# Patient Record
Sex: Male | Born: 1940 | Race: White | Hispanic: No | Smoking: Former smoker
Health system: Southern US, Community
[De-identification: ages and names within clinical notes are randomized; demographics above are authoritative.]

## PROBLEM LIST (undated history)

## (undated) DIAGNOSIS — I82409 Acute embolism and thrombosis of unspecified deep veins of unspecified lower extremity: Secondary | ICD-10-CM

## (undated) DIAGNOSIS — G4730 Sleep apnea, unspecified: Secondary | ICD-10-CM

## (undated) DIAGNOSIS — E785 Hyperlipidemia, unspecified: Secondary | ICD-10-CM

## (undated) DIAGNOSIS — Z8701 Personal history of pneumonia (recurrent): Secondary | ICD-10-CM

## (undated) DIAGNOSIS — Z8719 Personal history of other diseases of the digestive system: Secondary | ICD-10-CM

## (undated) DIAGNOSIS — J449 Chronic obstructive pulmonary disease, unspecified: Secondary | ICD-10-CM

## (undated) HISTORY — DX: Personal history of other diseases of the digestive system: Z87.19

## (undated) HISTORY — DX: Personal history of pneumonia (recurrent): Z87.01

## (undated) HISTORY — DX: Acute embolism and thrombosis of unspecified deep veins of unspecified lower extremity: I82.409

## (undated) HISTORY — PX: TONSILLECTOMY: SUR1361

## (undated) HISTORY — DX: Hyperlipidemia, unspecified: E78.5

## (undated) HISTORY — DX: Chronic obstructive pulmonary disease, unspecified: J44.9

---

## 2003-10-30 DIAGNOSIS — I82409 Acute embolism and thrombosis of unspecified deep veins of unspecified lower extremity: Secondary | ICD-10-CM

## 2003-10-30 HISTORY — DX: Acute embolism and thrombosis of unspecified deep veins of unspecified lower extremity: I82.409

## 2008-10-29 DIAGNOSIS — Z8701 Personal history of pneumonia (recurrent): Secondary | ICD-10-CM

## 2008-10-29 DIAGNOSIS — G4730 Sleep apnea, unspecified: Secondary | ICD-10-CM

## 2008-10-29 DIAGNOSIS — J449 Chronic obstructive pulmonary disease, unspecified: Secondary | ICD-10-CM

## 2008-10-29 HISTORY — DX: Personal history of pneumonia (recurrent): Z87.01

## 2008-10-29 HISTORY — DX: Chronic obstructive pulmonary disease, unspecified: J44.9

## 2008-10-29 HISTORY — DX: Sleep apnea, unspecified: G47.30

## 2011-01-30 ENCOUNTER — Encounter (INDEPENDENT_AMBULATORY_CARE_PROVIDER_SITE_OTHER): Payer: Medicare Other | Admitting: Vascular Surgery

## 2011-01-30 DIAGNOSIS — I714 Abdominal aortic aneurysm, without rupture: Secondary | ICD-10-CM

## 2011-01-30 NOTE — Consult Note (Signed)
NEW PATIENT CONSULTATION  Willits, Tradarius DOB:  07-24-1941                                       01/30/2011 ZOXWR#:[id number]  The patient presents today for evaluation of infrarenal abdominal aortic aneurysm.  He is a very pleasant 70 year old gentleman who was found by CT scan to have an infrarenal aneurysm.  This was on a CT scan for evaluation of his chest.  This had incomplete visualization but the lower cuts of the chest did show what appeared to be at least a 5.5 cm infrarenal abdominal aortic aneurysm.  He subsequently underwent ultrasound in August of 2011 and this revealed a maximal diameter of 4.8 cm.  He recently underwent a repeat ultrasound in March of 2012 and this showed an aneurysm maximum of 5.2 cm by ultrasound.  He has no symptoms referable to this.  He has no symptoms associated with aneurysm.  He does have extensive past medical history.  Most prominently he has severe COPD with continual oxygen therapy.  He quit smoking in 2010.  He does have a history of DVT in 2004 and is on chronic Coumadin therapy. Does have history of hyperlipidemia under treatment, diabetes, prior history of pneumonia, history of esophageal reflux.  PAST SURGICAL HISTORY:  Only past surgical history is tonsillectomy at age 78.  FAMILY HISTORY:  Mother with diabetes, deceased, and with cancer.  SOCIAL HISTORY:  He is a retired [employer] architect.  He is married.  He does not drink alcohol and no illicit drug use.  REVIEW OF SYSTEMS:  He does have some weight gain up to 190 pounds.  He is 5 feet 4 inches tall. CARDIAC:  Positive for shortness of breath with exertion. PULMONARY:  Oxygen use. HEMATOLOGIC:  History of prior DVT. GU, ENT, musculoskeletal, psychiatric and skin rest of his review of systems are all negative.  PHYSICAL EXAMINATION:  General:  A well-developed white male appearing stated age in no acute distress, using oxygen.  Vital signs:  His blood pressure is  116/73, pulse 97, respirations 22.  HEENT:  Normal.  Chest: Clear bilaterally without rales, rhonchi or wheezes.  Heart:  Regular rate and rhythm.  He does have 2+ radial pulses bilaterally.  Carotid arteries without bruits bilaterally.  He does have 2+ dorsalis pedis pulses bilaterally.  He does have significant edema and changes of chronic venous stasis disease bilaterally in both lower extremities. Abdomen:  Obese and nontender.  I do not feel any masses.  I cannot palpate an aneurysm.  Musculoskeletal:  Shows no major deformities or cyanosis.  Neurological:  No focal weakness or paresthesias.  Skin: Without ulcers or rashes.  I did review his ultrasound and also reviewed his CT films.  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  This is some variance from his ultrasound.  I discussed all of this at length with the patient and his wife present.  I explained the treatment options for aneurysms with both open surgical repair and endovascular stent graft.  He certainly appears to be borderline option for this due to the mural thrombus at his level of the renal arteries. I explained we would require a formal duplex to determine if he is able to proceed with stent graft repair.  We will  obtain a CT angiogram at [facility] in the next several weeks at his convenience and then see him again for discussion and review of the films with him.    Larina Earthly, M.D. Electronically Signed  TFE/MEDQ  D:  01/30/2011  T:  01/30/2011  Job:  [id number]  cc:   Gaye Alken, NP Dr Barney Drain

## 2011-02-20 ENCOUNTER — Ambulatory Visit (INDEPENDENT_AMBULATORY_CARE_PROVIDER_SITE_OTHER): Payer: Medicare Other | Admitting: Vascular Surgery

## 2011-02-20 DIAGNOSIS — I714 Abdominal aortic aneurysm, without rupture: Secondary | ICD-10-CM

## 2011-02-21 NOTE — Assessment & Plan Note (Signed)
OFFICE VISIT  ORRY, SIGL DOB:  07-10-1941                                       02/20/2011 [id number]#:[id number]  The patient presents today for a discussion regarding his infrarenal abdominal aortic aneurysm.  I had seen him on 01/30/2011 with ultrasound showing expanding infrarenal abdominal aortic aneurysm.  He is here today for followup after a subsequent CT scan on 02/07/2011 at [facility], a formal CT angiogram to determine the extent of his aneurysm. This did confirm a 5.6 infrarenal abdominal aortic aneurysm.  The aneurysm begins approximately 2 cm above the level of the renal artery. He has 2 right renal and 1 left renal artery.  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  The aneurysm stops at the bifurcation, and he has normal iliac arteries below this.  I had a long discussion with the patient and his wife present.  His physical exam is otherwise unchanged.  Vital Signs:  Blood pressure 103/65, pulse 87, respirations 18.  Abdomen:  Soft, nontender, moderately obese.  He does have palpable popliteal pulses bilaterally.  I have discussed again the indications for repair.  Since he has had expansion and is now up to 5.6 cm, I would recommend elective repair.  I explained that he is an acceptable candidate for endograft stenting for treatment of his aneurysm.  I did explain the only concern being the partial mural thrombus at the level of the implantation, I feel that he will get an adequate seal.  He is at a moderately high risk for open repair due to his severe COPD.  He understands and we will schedule this within the next 1-2 weeks at his convenience.    Larina Earthly, M.D. Electronically Signed  TFE/MEDQ  D:  02/21/2011  T:  02/21/2011  Job:  [id number]  cc:   Dr. Alfonse Spruce, NP

## 2011-02-26 ENCOUNTER — Ambulatory Visit (HOSPITAL_COMMUNITY)
Admission: RE | Admit: 2011-02-26 | Discharge: 2011-02-26 | Disposition: A | Discharge status: Home / Self Care | Payer: Medicare Other | Source: Ambulatory Visit | Attending: Vascular Surgery | Admitting: Vascular Surgery

## 2011-02-26 ENCOUNTER — Other Ambulatory Visit: Payer: Self-pay | Admitting: Vascular Surgery

## 2011-02-26 ENCOUNTER — Encounter (HOSPITAL_COMMUNITY)
Admission: RE | Admit: 2011-02-26 | Discharge: 2011-02-26 | Disposition: A | Discharge status: Home / Self Care | Payer: Medicare Other | Source: Ambulatory Visit | Attending: Vascular Surgery | Admitting: Vascular Surgery

## 2011-02-26 DIAGNOSIS — J984 Other disorders of lung: Secondary | ICD-10-CM | POA: Insufficient documentation

## 2011-02-26 DIAGNOSIS — Z01818 Encounter for other preprocedural examination: Secondary | ICD-10-CM | POA: Insufficient documentation

## 2011-02-26 DIAGNOSIS — I714 Abdominal aortic aneurysm, without rupture: Secondary | ICD-10-CM

## 2011-02-26 DIAGNOSIS — Z01812 Encounter for preprocedural laboratory examination: Secondary | ICD-10-CM | POA: Insufficient documentation

## 2011-02-26 LAB — URINALYSIS, ROUTINE W REFLEX MICROSCOPIC
Glucose, UA: NEGATIVE mg/dL
Ketones, ur: NEGATIVE mg/dL
Protein, ur: NEGATIVE mg/dL
Urobilinogen, UA: 0.2 mg/dL (ref 0.0–1.0)

## 2011-02-26 LAB — URINE MICROSCOPIC-ADD ON

## 2011-02-26 LAB — BLOOD GAS, ARTERIAL
Acid-Base Excess: 0.2 mmol/L (ref 0.0–2.0)
Bicarbonate: 24.6 mEq/L — ABNORMAL HIGH (ref 20.0–24.0)
Patient temperature: 98.6
TCO2: 25.8 mmol/L (ref 0–100)
pH, Arterial: 7.389 (ref 7.350–7.450)

## 2011-02-26 LAB — CBC
HCT: 40.9 % (ref 39.0–52.0)
Hemoglobin: 13.4 g/dL (ref 13.0–17.0)
MCHC: 32.8 g/dL (ref 30.0–36.0)
MCV: 99 fL (ref 78.0–100.0)

## 2011-02-26 LAB — COMPREHENSIVE METABOLIC PANEL
ALT: 16 U/L (ref 0–53)
AST: 18 U/L (ref 0–37)
Albumin: 3.6 g/dL (ref 3.5–5.2)
Alkaline Phosphatase: 55 U/L (ref 39–117)
Chloride: 108 mEq/L (ref 96–112)
GFR calc Af Amer: >60 mL/min (ref >60)
Potassium: 4.1 mEq/L (ref 3.5–5.1)
Sodium: 139 mEq/L (ref 135–145)
Total Bilirubin: 0.4 mg/dL (ref 0.3–1.2)
Total Protein: 6.4 g/dL (ref 6.0–8.3)

## 2011-02-26 LAB — APTT: aPTT: 47 seconds — ABNORMAL HIGH (ref 24–37)

## 2011-02-26 LAB — ABO/RH: ABO/RH(D): A POS

## 2011-02-26 LAB — PROTIME-INR: INR: 2.11 — ABNORMAL HIGH (ref 0.00–1.49)

## 2011-03-02 ENCOUNTER — Inpatient Hospital Stay (HOSPITAL_COMMUNITY): Payer: Medicare Other

## 2011-03-02 ENCOUNTER — Inpatient Hospital Stay (HOSPITAL_COMMUNITY)
Admission: RE | Admit: 2011-03-02 | Discharge: 2011-03-03 | DRG: 238 | Disposition: A | Discharge status: Home / Self Care | Payer: Medicare Other | Source: Ambulatory Visit | Attending: Vascular Surgery | Admitting: Vascular Surgery

## 2011-03-02 DIAGNOSIS — I714 Abdominal aortic aneurysm, without rupture, unspecified: Principal | ICD-10-CM | POA: Diagnosis present

## 2011-03-02 DIAGNOSIS — J4489 Other specified chronic obstructive pulmonary disease: Secondary | ICD-10-CM | POA: Diagnosis present

## 2011-03-02 DIAGNOSIS — F17200 Nicotine dependence, unspecified, uncomplicated: Secondary | ICD-10-CM | POA: Diagnosis present

## 2011-03-02 DIAGNOSIS — Z86718 Personal history of other venous thrombosis and embolism: Secondary | ICD-10-CM

## 2011-03-02 DIAGNOSIS — Z7982 Long term (current) use of aspirin: Secondary | ICD-10-CM

## 2011-03-02 DIAGNOSIS — Z7901 Long term (current) use of anticoagulants: Secondary | ICD-10-CM

## 2011-03-02 DIAGNOSIS — Z79899 Other long term (current) drug therapy: Secondary | ICD-10-CM

## 2011-03-02 DIAGNOSIS — G4733 Obstructive sleep apnea (adult) (pediatric): Secondary | ICD-10-CM | POA: Diagnosis present

## 2011-03-02 DIAGNOSIS — J449 Chronic obstructive pulmonary disease, unspecified: Secondary | ICD-10-CM | POA: Diagnosis present

## 2011-03-02 HISTORY — PX: ABDOMINAL AORTIC ANEURYSM REPAIR: SUR1152

## 2011-03-02 LAB — CBC
MCH: 32.6 pg (ref 26.0–34.0)
MCHC: 32.7 g/dL (ref 30.0–36.0)
MCV: 99.7 fL (ref 78.0–100.0)
Platelets: 194 10*3/uL (ref 150–400)
RDW: 16.4 % — ABNORMAL HIGH (ref 11.5–15.5)

## 2011-03-02 LAB — TYPE AND SCREEN

## 2011-03-02 LAB — BASIC METABOLIC PANEL
CO2: 28 mEq/L (ref 19–32)
Calcium: 8.5 mg/dL (ref 8.4–10.5)
Creatinine, Ser: 0.76 mg/dL (ref 0.4–1.5)
GFR calc non Af Amer: >60 mL/min (ref >60)
Glucose, Bld: 141 mg/dL — ABNORMAL HIGH (ref 70–99)

## 2011-03-02 LAB — URINALYSIS, ROUTINE W REFLEX MICROSCOPIC
Bilirubin Urine: NEGATIVE
Glucose, UA: NEGATIVE mg/dL
Ketones, ur: NEGATIVE mg/dL
Nitrite: NEGATIVE
Specific Gravity, Urine: 1.018 (ref 1.005–1.030)
pH: 8.5 — ABNORMAL HIGH (ref 5.0–8.0)

## 2011-03-02 LAB — APTT: aPTT: 32 seconds (ref 24–37)

## 2011-03-02 LAB — URINE MICROSCOPIC-ADD ON

## 2011-03-02 LAB — PROTIME-INR
Prothrombin Time: 16.3 seconds — ABNORMAL HIGH (ref 11.6–15.2)
Prothrombin Time: 17.4 seconds — ABNORMAL HIGH (ref 11.6–15.2)

## 2011-03-03 LAB — TYPE AND SCREEN
ABO/RH(D): A POS
Unit division: 0

## 2011-03-03 LAB — BASIC METABOLIC PANEL
BUN: 10 mg/dL (ref 6–23)
Creatinine, Ser: 0.65 mg/dL (ref 0.4–1.5)
GFR calc non Af Amer: >60 mL/min (ref >60)
Glucose, Bld: 132 mg/dL — ABNORMAL HIGH (ref 70–99)
Potassium: 4.1 mEq/L (ref 3.5–5.1)

## 2011-03-03 LAB — CBC
HCT: 33.1 % — ABNORMAL LOW (ref 39.0–52.0)
MCH: 31.7 pg (ref 26.0–34.0)
MCHC: 32 g/dL (ref 30.0–36.0)
MCV: 99.1 fL (ref 78.0–100.0)
RDW: 16.3 % — ABNORMAL HIGH (ref 11.5–15.5)

## 2011-03-06 ENCOUNTER — Other Ambulatory Visit: Payer: Self-pay | Admitting: Vascular Surgery

## 2011-03-06 DIAGNOSIS — I714 Abdominal aortic aneurysm, without rupture: Secondary | ICD-10-CM

## 2011-04-03 ENCOUNTER — Ambulatory Visit
Admission: RE | Admit: 2011-04-03 | Discharge: 2011-04-03 | Disposition: A | Discharge status: Home / Self Care | Payer: Medicare Other | Source: Ambulatory Visit | Attending: Vascular Surgery | Admitting: Vascular Surgery

## 2011-04-03 ENCOUNTER — Ambulatory Visit (INDEPENDENT_AMBULATORY_CARE_PROVIDER_SITE_OTHER): Payer: Medicare Other | Admitting: Vascular Surgery

## 2011-04-03 DIAGNOSIS — I714 Abdominal aortic aneurysm, without rupture: Secondary | ICD-10-CM

## 2011-04-03 MED ORDER — IOHEXOL 350 MG/ML SOLN
100.0000 mL | Freq: Once | INTRAVENOUS | Status: AC | PRN
  Administered 2011-04-03: 100 mL via INTRAVENOUS

## 2011-04-04 NOTE — Assessment & Plan Note (Signed)
OFFICE VISIT  Zirbes, Alan Ross DOB:  1941/03/30                                       04/03/2011 ZOXWR#:[id number]  The patient presents today for followup of stent graft repair of infrarenal abdominal aortic aneurysm on 03/02/11.  He did well in the hospital and was discharged home on postoperative day #1.  He has had no difficulty and did not fill his prescription for pain since he did not have any significant discomfort.  He has well-healed groin incisions bilaterally and has normal femoral and normal dorsalis pedis pulses bilaterally.  He underwent a CT scan today for 9-month followup, and this shows excellent positioning of his stent graft, stable maximal diameter of his sac of 5.8 cm and has no evidence of endoleak.  I discussed this at length with the patient and his wife and am pleased with his initial result.  We will see him again in 6 months for repeat CT scan.    Larina Earthly, M.D. Electronically Signed  TFE/MEDQ  D:  04/03/2011  T:  04/04/2011  Job:  [id number]  cc:   Foye Deer, MD Gaye Alken, NP

## 2011-04-17 NOTE — Op Note (Signed)
Alan Ross, Alan Ross                   ACCOUNT NO.:  [id number]  MEDICAL RECORD NO.:  [id number]           PATIENT TYPE:  I  LOCATION:  3312                         FACILITY:  [facility]  PHYSICIAN:  Larina Earthly, M.D.    DATE OF BIRTH:  09-09-1941  DATE OF PROCEDURE:  03/02/2011 DATE OF DISCHARGE:                              OPERATIVE REPORT   PREOPERATIVE DIAGNOSIS:  Infrarenal abdominal aortic aneurysm.  POSTOPERATIVE DIAGNOSIS:  Infrarenal abdominal aortic aneurysm.  PROCEDURE:  Gore stent graft repair of infrarenal abdominal aortic aneurysm.  SURGEON:  Larina Earthly, MD  ASSISTANT:  Pecola Leisure, PA-C  ANESTHESIA:  General endotracheal.  COMPLICATIONS:  None.  DISPOSITION:  To recovery room, extubated in stable condition.  INDICATIONS FOR PROCEDURE:  The patient is a 70 year old gentleman with increasingly enlarging infrarenal abdominal aortic aneurysm. Preoperative evaluation revealed infrarenal position with approximately 2-cm infrarenal neck.  The aneurysm stopped at the level of the bifurcation.  The patient did have significant amount of mural thrombus in the infrarenal aorta just below the takeoff of the renal arteries. This was approximately one-third of the posterior aspect.  I discussed the options with Mr. Mahone.  He does have significant pulmonary compromise, feels that he would be significant to a higher risk with open repair, I feel that he would achieve long-term durability with stent grafting and I recommended this.  He understands potential for conversion to open repair or eventual need for additional treatment should he have nonhealing of his stent graft.  The patient taken to the operating room at this time for surgery.  PROCEDURE IN DETAIL:  The patient was taken to the operating room, placed in supine position where the area of abdomen and both groins prepped and draped in usual sterile fashion.  Incisions were made over both common femoral arteries  just below the inguinal ligament and carried down to isolate the common femoral arteries bilaterally.  These were mobilized to give adequate exposure and were encircled with vessel loops.  Using an 18-gauge needle, and the Seldinger technique, a guidewire was passed through the right common femoral artery up to the level of the suprarenal aorta.  An 8-French sheath was passed over the guidewire.  Similarly on the left side using a Seldinger technique, a 8- French sheath was positioned in the common femoral artery with a guidewire proximally.  A marker pigtail catheter was positioned through the right sheath and an AP projection was undertaken.  This revealed the location of the renal arteries.  Preoperative planning had been for a 23 x 12 with a 16-cm main body on imaging with a marker pigtail catheter, this was the appropriate size allowing deployment of the ipsilateral limb into the common femoral artery above the hypogastric takeoff.  An 18-French sheath was brought onto the field and after the guidewire was used to exchange with a Amplatz super stiff wire and 18-French sheath was positioned through the right groin up to the level of the aneurysm sac.  The main body portion of the graft was delivered at the appropriate level just below the level of the  renal artery takeoff. Pigtail catheter was positioned through the left sheath and a injection was taken with the device positioned to confirm the level of the renal arteries.  The proximal portion of the main body was deployed and was in good position.  Next using angled catheter and the Glidewire, the contralateral gate was cannulated, this was confirmed by placing a marker pigtail catheter in this level and twisting it to confirm adequate deployment.  This was then exchanged for an Amplatz super stiff wire after hand injection through the left femoral sheath revealed the level of the hypogastric takeoff.  A 12 mm x 12 cm length  contralateral limb was brought onto the field.  The 8-French sheath was exchanged for 12-French sheath and the 12 mm x 12 cm contralateral limb was positioned at the appropriate level and the ipsilateral limb was then completely deployed as well.  The juncture of the contralateral limb was ballooned as was the distal connection site bilaterally.  The proximal aorta was not balloon due to the posterior thrombus.  Pigtail catheter was repositioned above the level of renal arteries and a completion arteriogram revealed excellent positioning with no evidence of Endo leak.  The guidewires and sheaths were removed.  The patient had been given 5000 units of intravenous heparin before placing the large sheath. The common femoral arteries were repaired bilaterally with running 5-0 Prolene sutures.  The patient was given 50 mg of protamine to reverse the heparin.  The wound was irrigated with saline.  Hemostasis obtained with cautery.  The wounds were closed with 2-0 Vicryl subcutaneous tissue and the skin was closed with 3-0 subcuticular Vicryl stitch. Sterile dressings were applied.  The patient was extubated and taken to the recovery room in stable condition.     Larina Earthly, M.D.     TFE/MEDQ  D:  03/02/2011  T:  03/03/2011  Job:  [id number]  cc:   Dr. Foye Deer Gaye Alken, NP  Electronically Signed by Ritesh Opara M.D. on 04/17/2011 01:08:57 PM

## 2011-10-26 ENCOUNTER — Other Ambulatory Visit: Payer: Self-pay

## 2011-10-26 DIAGNOSIS — I714 Abdominal aortic aneurysm, without rupture: Secondary | ICD-10-CM

## 2011-11-20 ENCOUNTER — Ambulatory Visit: Payer: Medicare Other | Admitting: Vascular Surgery

## 2011-11-21 ENCOUNTER — Encounter: Payer: Self-pay | Admitting: Vascular Surgery

## 2011-11-26 ENCOUNTER — Encounter: Payer: Self-pay | Admitting: Vascular Surgery

## 2011-11-27 ENCOUNTER — Encounter: Payer: Self-pay | Admitting: Vascular Surgery

## 2011-11-27 ENCOUNTER — Ambulatory Visit (INDEPENDENT_AMBULATORY_CARE_PROVIDER_SITE_OTHER): Payer: Medicare Other | Admitting: Vascular Surgery

## 2011-11-27 ENCOUNTER — Ambulatory Visit
Admission: RE | Admit: 2011-11-27 | Discharge: 2011-11-27 | Disposition: A | Discharge status: Home / Self Care | Payer: Medicare Other | Source: Ambulatory Visit | Attending: Vascular Surgery | Admitting: Vascular Surgery

## 2011-11-27 VITALS — BP 117/77 | HR 67 | Resp 14 | Ht 64.5 in | Wt 183.0 lb

## 2011-11-27 DIAGNOSIS — I714 Abdominal aortic aneurysm, without rupture, unspecified: Secondary | ICD-10-CM | POA: Insufficient documentation

## 2011-11-27 MED ORDER — IOHEXOL 350 MG/ML SOLN: 75.0000 mL | Freq: Once | INTRAVENOUS | Status: AC | PRN

## 2011-11-27 NOTE — Progress Notes (Signed)
[facility] [place]   Patient name: Alan Ross MRN: [id number] DOB: 1941-09-20 Sex: male   Referred by: Waynetta Sandy  Reason for referral:  Chief Complaint  Patient presents with  . AAA    6 month follow up    HISTORY OF PRESENT ILLNESS: The patient has today for followup of stent graft repair aortic aneurysm May 2012 he had a CT scan at one month and is here today for repeat 6 month CT scan. He does have severe COPD. He reports this is stable. He has no symptoms referable to his aneurysm.  Past Medical History  Diagnosis Date  . COPD (chronic obstructive pulmonary disease)   . DVT (deep venous thrombosis) 2004  . Hyperlipidemia   . Diabetes mellitus   . History of pneumonia   . History of esophageal reflux     Past Surgical History  Procedure Date  . Tonsillectomy at age 69 per pt.  . Abdominal aortic aneurysm repair 03-02-2011     stent graft repair of abdominal aortic aneurysm   by Dr. Tawanna Cooler Aydin Hink    History   Social History  . Marital Status: Married    Spouse Name: N/A    Number of Children: N/A  . Years of Education: N/A   Occupational History  . Not on file.   Social History Main Topics  . Smoking status: Former Smoker    Quit date: 10/29/2008  . Smokeless tobacco: Not on file  . Alcohol Use: No  . Drug Use: No  . Sexually Active:    Other Topics Concern  . Not on file   Social History Narrative  . No narrative on file    Family History  Problem Relation Age of Onset  . Diabetes Mother   . Cancer Mother     Allergies as of 11/27/2011 - Review Complete 11/27/2011  Allergen Reaction Noted  . Penicillins  11/21/2011    Current Outpatient Prescriptions on File Prior to Visit  Medication Sig Dispense Refill  . aspirin 81 MG tablet Take 81 mg by mouth daily.      . Cholecalciferol (VITAMIN D3) 1000 UNITS CAPS Take by mouth daily.      . fish oil-omega-3 fatty acids 1000 MG capsule Take 2 g by mouth daily.      . folic acid  (FOLVITE) 1 MG tablet Take 1 mg by mouth daily.      . furosemide (LASIX) 20 MG tablet Take 20 mg by mouth daily.      . Iron 66 MG TABS Take by mouth.      . niacin 500 MG tablet Take 500 mg by mouth daily.      . potassium chloride (K-DUR) 10 MEQ tablet Take 10 mEq by mouth daily.      . simvastatin (ZOCOR) 40 MG tablet Take 40 mg by mouth daily.      . vitamin B-12 (CYANOCOBALAMIN) 500 MCG tablet Take 500 mcg by mouth daily.      . WARFARIN SODIUM PO Take by mouth. As directed.       Current Facility-Administered Medications on File Prior to Visit  Medication Dose Route Frequency Provider Last Rate Last Dose  . iohexol (OMNIPAQUE) 350 MG/ML injection 75 mL  75 mL Intravenous Once PRN Medication Radiologist, MD         PHYSICAL EXAMINATION:  General: The patient is a well-nourished male, in no acute distress. Vital signs are BP 117/77  Pulse 67  Resp 14  Ht 5' 4.5" (1.638 m)  Wt 183 lb (83.008 kg)  BMI 30.93 kg/m2  SpO2 92% Pulmonary: There is a good air exchange bilaterally with wheezing  Abdomen: Soft and non-tender with normal pitch bowel sounds. Obese no aneurysm palpable Musculoskeletal: There are no major deformities.  There is no significant extremity pain. Neurologic: No focal weakness or paresthesias are detected, Skin: There are no ulcer or rashes noted. Psychiatric: The patient has normal affect. Cardiovascular: There is a regular rate and rhythm without significant murmur appreciated. Pulse status: 2+ her cells pedis pulses  CT scan: Excellent positioning of stent graft with no evidence of endoleak. Slight decrease an aneurysm sac to 5.6 from 5.8 cm. Patient does have mural thrombus at the posterior attachment site proximally and this appears to be stable  Impression and Plan:  Successful treatment of infrarenal abdominal aortic aneurysm with stent graft. Facial be seen again in one year with repeat CT followup in the office visit    Carlis Burnsworth F [facility] [place] Office: [phone]

## 2012-08-22 ENCOUNTER — Other Ambulatory Visit: Payer: Self-pay | Admitting: *Deleted

## 2012-08-22 DIAGNOSIS — I714 Abdominal aortic aneurysm, without rupture: Secondary | ICD-10-CM

## 2012-08-22 DIAGNOSIS — Z48812 Encounter for surgical aftercare following surgery on the circulatory system: Secondary | ICD-10-CM

## 2012-11-24 ENCOUNTER — Encounter: Payer: Self-pay | Admitting: Vascular Surgery

## 2012-11-25 ENCOUNTER — Inpatient Hospital Stay: Admission: RE | Admit: 2012-11-25 | Payer: Medicare Other | Source: Ambulatory Visit

## 2012-11-25 ENCOUNTER — Encounter: Payer: Self-pay | Admitting: Vascular Surgery

## 2012-11-25 ENCOUNTER — Ambulatory Visit (INDEPENDENT_AMBULATORY_CARE_PROVIDER_SITE_OTHER): Payer: Medicare Other | Admitting: Vascular Surgery

## 2012-11-25 ENCOUNTER — Ambulatory Visit
Admission: RE | Admit: 2012-11-25 | Discharge: 2012-11-25 | Disposition: A | Discharge status: Home / Self Care | Payer: Medicare Other | Source: Ambulatory Visit | Attending: Vascular Surgery | Admitting: Vascular Surgery

## 2012-11-25 VITALS — BP 114/72 | HR 104 | Ht 64.5 in | Wt 171.9 lb

## 2012-11-25 DIAGNOSIS — I714 Abdominal aortic aneurysm, without rupture: Secondary | ICD-10-CM

## 2012-11-25 DIAGNOSIS — Z48812 Encounter for surgical aftercare following surgery on the circulatory system: Secondary | ICD-10-CM

## 2012-11-25 DIAGNOSIS — N200 Calculus of kidney: Secondary | ICD-10-CM

## 2012-11-25 MED ORDER — IOHEXOL 350 MG/ML SOLN
100.0000 mL | Freq: Once | INTRAVENOUS | Status: AC | PRN
  Administered 2012-11-25: 100 mL via INTRAVENOUS

## 2012-11-25 NOTE — Progress Notes (Signed)
The patient has today for followup of the stent graft repair of abdominal aortic aneurysm with a Gore stent graft in May of 2012. He recently suffered the death of his wife and is recovering from this. He has no symptoms referable to his aneurysm. He does have problems with coronary dysfunction having quit smoking in 2010. He does have a remote history of kidney stones many years ago. I questioned regarding any recent pain he does report that he had some left flank pain over the weekend. He has not had hematuria.  Past Medical History  Diagnosis Date  . COPD (chronic obstructive pulmonary disease)   . DVT (deep venous thrombosis) 2004  . Hyperlipidemia   . Diabetes mellitus   . History of pneumonia   . History of esophageal reflux     History  Substance Use Topics  . Smoking status: Former Smoker    Quit date: 10/29/2008  . Smokeless tobacco: Not on file  . Alcohol Use: No    Family History  Problem Relation Age of Onset  . Diabetes Mother   . Cancer Mother     Allergies  Allergen Reactions  . Penicillins     Current outpatient prescriptions:aspirin 81 MG tablet, Take 81 mg by mouth daily., Disp: , Rfl: ;  Cholecalciferol (VITAMIN D3) 1000 UNITS CAPS, Take by mouth daily., Disp: , Rfl: ;  fish oil-omega-3 fatty acids 1000 MG capsule, Take 2 g by mouth daily., Disp: , Rfl: ;  folic acid (FOLVITE) 1 MG tablet, Take 1 mg by mouth daily., Disp: , Rfl: ;  furosemide (LASIX) 20 MG tablet, Take 20 mg by mouth daily., Disp: , Rfl:  ipratropium-albuterol (DUONEB) 0.5-2.5 (3) MG/3ML SOLN, Inhale into the lungs 4 (four) times daily., Disp: , Rfl: ;  Iron 66 MG TABS, Take by mouth., Disp: , Rfl: ;  niacin 500 MG tablet, Take 500 mg by mouth daily., Disp: , Rfl: ;  potassium chloride (K-DUR) 10 MEQ tablet, Take 10 mEq by mouth daily., Disp: , Rfl: ;  simvastatin (ZOCOR) 40 MG tablet, Take 40 mg by mouth daily., Disp: , Rfl:  vitamin B-12 (CYANOCOBALAMIN) 500 MCG tablet, Take 500 mcg by mouth  daily., Disp: , Rfl: ;  WARFARIN SODIUM PO, Take by mouth. As directed., Disp: , Rfl:   BP 114/72  Pulse 104  Ht 5' 4.5" (1.638 m)  Wt 171 lb 14.4 oz (77.973 kg)  BMI 29.05 kg/m2  SpO2 92%  Body mass index is 29.05 kg/(m^2).       Physical exam well-developed white male no acute distress radial and femoral pulses are 2+. Abdominal exam reveals obesity no tenderness. Doesn't drink changes of chronic venous hypertension in both lower extremity is.  I reviewed his CT scan from today. This shows excellent positioning of the stent graft with no evidence of endoleak. He has had continued shrinking of the size of his aneurysm sac. Initially the maximal diameter is 5.8 and is now down to 4.6 cm.  He does have a subtle finding of a left kidney stone obstructing his ureteropelvic junction and causing some hydronephrosis.  Impression and plan #1 a stable positioning of Gore stent graft with continued drainage and size of his native aneurysm. We'll see him again in one year with repeat CT scan  #2 left obstructing kidney stone. Will refer to [facility]

## 2012-11-26 ENCOUNTER — Other Ambulatory Visit: Payer: Self-pay | Admitting: *Deleted

## 2012-11-26 DIAGNOSIS — I714 Abdominal aortic aneurysm, without rupture: Secondary | ICD-10-CM

## 2012-11-26 DIAGNOSIS — Z48812 Encounter for surgical aftercare following surgery on the circulatory system: Secondary | ICD-10-CM

## 2012-12-16 ENCOUNTER — Other Ambulatory Visit: Payer: Self-pay | Admitting: Urology

## 2012-12-16 NOTE — Pre-Procedure Instructions (Signed)
Asked to bring blue folder the day of the procedure,insurance card,I.D. driver's license,wear comfortable clothing and have a driver for the day. Asked not to take Advil,Motrin,Ibuprofen,Aleve or any NSAIDS, Aspirin, or Toradol for 72 hours prior to procedure,  No vitamins or herbal medications 7 days prior to procedure. Stopped Warfain  And Aspirin 12-15-2012 and plans to stop vitamins 12-16-2012. Instructed to take laxative per doctor's office instructions and eat a light dinner the evening before procedure.   To arrive at 0530  for lithotripsy procedure.

## 2012-12-17 ENCOUNTER — Encounter (HOSPITAL_COMMUNITY): Payer: Self-pay | Admitting: Pharmacy Technician

## 2012-12-22 ENCOUNTER — Encounter (HOSPITAL_COMMUNITY)
Admission: RE | Disposition: A | Discharge status: Home / Self Care | Payer: Self-pay | Source: Ambulatory Visit | Attending: Urology

## 2012-12-22 ENCOUNTER — Encounter (HOSPITAL_COMMUNITY): Payer: Self-pay | Admitting: *Deleted

## 2012-12-22 ENCOUNTER — Ambulatory Visit (HOSPITAL_COMMUNITY): Payer: Medicare Other

## 2012-12-22 ENCOUNTER — Ambulatory Visit (HOSPITAL_COMMUNITY)
Admission: RE | Admit: 2012-12-22 | Discharge: 2012-12-22 | Disposition: A | Discharge status: Home / Self Care | Payer: Medicare Other | Source: Ambulatory Visit | Attending: Urology | Admitting: Urology

## 2012-12-22 DIAGNOSIS — G4730 Sleep apnea, unspecified: Secondary | ICD-10-CM | POA: Insufficient documentation

## 2012-12-22 DIAGNOSIS — I714 Abdominal aortic aneurysm, without rupture, unspecified: Secondary | ICD-10-CM | POA: Insufficient documentation

## 2012-12-22 DIAGNOSIS — N201 Calculus of ureter: Secondary | ICD-10-CM | POA: Diagnosis present

## 2012-12-22 DIAGNOSIS — Z86718 Personal history of other venous thrombosis and embolism: Secondary | ICD-10-CM | POA: Insufficient documentation

## 2012-12-22 LAB — PROTIME-INR
INR: 1.01 (ref 0.00–1.49)
Prothrombin Time: 13.2 seconds (ref 11.6–15.2)

## 2012-12-22 SURGERY — LITHOTRIPSY, ESWL
Anesthesia: LOCAL | Laterality: Left

## 2012-12-22 MED ORDER — TAMSULOSIN HCL 0.4 MG PO CAPS: 0.4000 mg | ORAL_CAPSULE | ORAL | Status: AC

## 2012-12-22 MED ORDER — OXYCODONE-ACETAMINOPHEN 5-500 MG PO CAPS: 1.0000 | ORAL_CAPSULE | ORAL | Status: AC | PRN

## 2012-12-22 MED ORDER — SODIUM CHLORIDE 0.9 % IV SOLN
INTRAVENOUS | Status: DC
  Administered 2012-12-22: 07:00:00 via INTRAVENOUS

## 2012-12-22 MED ORDER — DIAZEPAM 5 MG PO TABS
10.0000 mg | ORAL_TABLET | ORAL | Status: AC
  Administered 2012-12-22: 10 mg via ORAL
  Filled 2012-12-22: qty 2

## 2012-12-22 MED ORDER — CIPROFLOXACIN HCL 500 MG PO TABS
500.0000 mg | ORAL_TABLET | ORAL | Status: AC
  Administered 2012-12-22: 500 mg via ORAL
  Filled 2012-12-22: qty 1

## 2012-12-22 MED ORDER — DIPHENHYDRAMINE HCL 25 MG PO CAPS
25.0000 mg | ORAL_CAPSULE | ORAL | Status: AC
  Administered 2012-12-22: 25 mg via ORAL
  Filled 2012-12-22: qty 1

## 2012-12-22 NOTE — Progress Notes (Addendum)
Reddened area with a few petechiae noted on left lower back from lithotripsy. No drainage noted and patient denies pain. He verbalizes understanding of discharge instructions and instructed to call for problems and concerns following discharge.

## 2012-12-22 NOTE — Op Note (Signed)
See [facility] OP note scanned into chart. 

## 2012-12-22 NOTE — H&P (Signed)
Reason For Visit     Alan Ross is a 72 year old male patient of Dr. Arbie Cookey with a left UPJ stone.   History of Present Illness     Calculus: A CT scan performed 11/25/12 revealed a 15 mm stone located at the left UPJ causing some obstruction. No other renal calculi were noted.  He told me that he has not had any flank pain or hematuria. He has had stones in the past and passed a stone spontaneously in the early 70s. He has never required any form of procedure for his stones. He also has no history of UTIs, pyelonephritis or other urologic difficulty. He has no voiding complaints other than some frequency after he takes his Lasix in the morning. .   Past Medical History Problems  1. History of  Adult Sleep Apnea 780.57 2. History of  Chronic Obstructive Pulmonary Disease 496 3. History of  Diabetes Mellitus 250.00 4. History of  Esophageal Reflux 530.81 5. History of  Gastric Ulcer 531.90 6. History of  Hyperlipidemia 272.4 7. History of  Pneumonia V12.61 8. 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 History of  Cath Stent Placement 2. History of  Surgery For Abdominal Aortic Aneurysm 3. History of  Tonsillectomy  Current Meds 1. Albuterol Sulfate NEBU; Therapy: (Recorded:18Feb2014) to 2. Aspirin 81 MG Oral Tablet; Therapy: (Recorded:18Feb2014) to 3. Ferrous Sulfate 325 MG CAPS; Therapy: (Recorded:18Feb2014) to 4. Fish Oil 1000 MG Oral Capsule; Therapy: (Recorded:18Feb2014) to 5. Folic Acid 1 MG Oral Tablet; Therapy: (Recorded:18Feb2014) to 6. Furosemide 20 MG Oral Tablet; Therapy: 25Jul2013 to 7. Niacin 500 MG Oral Tablet; Therapy: (Recorded:18Feb2014) to 8. Oxygen Use; Therapy: (Recorded:18Feb2014) to 9. Potassium Chloride ER 10 MEQ Oral Tablet Extended Release; Therapy: 21Jan2013 to 10. Simvastatin 40 MG Oral Tablet; Therapy: (Recorded:18Feb2014) to 11. Vitamin B-12 500 MCG Oral Tablet; Therapy: (Recorded:18Feb2014)  to 12. Vitamin D3 10000 UNIT Oral Capsule; Therapy: (Recorded:18Feb2014) to 13. Warfarin Sodium TABS; Therapy: (Recorded:18Feb2014) to  Allergies Medication  1. Penicillins  Family History Problems  1. Paternal history of  Acute Myocardial Infarction V17.3 2. Family history of  Death In The Family Father 3. Family history of  Death In The Family Mother 4. Maternal history of  Diabetes Mellitus V18.0 5. Family history of  Family Health Status Children ___ Living Sons  Social History Problems    Caffeine Use   Former Smoker V15.82   Marital History - Widowed   Retired From Work Denied    History of  Alcohol Use  Review of Systems Genitourinary, constitutional, skin, eye, otolaryngeal, hematologic/lymphatic, cardiovascular, pulmonary, endocrine, musculoskeletal, gastrointestinal, neurological and psychiatric system(s) were reviewed and pertinent findings if present are noted.  Genitourinary: urinary frequency and feelings of urinary urgency.  Respiratory: shortness of breath.  Psychiatric: anxiety.    Vitals Vital Signs  Blood Pressure: 128 / 80 Temperature: 97.1 F Heart Rate: 88  BMI Calculated: 28.84 BSA Calculated: 1.84 Height: 5 ft 4.5 in Weight: 171 lb   Physical Exam Constitutional: Well nourished and well developed . No acute distress.  ENT:. The ears and nose are normal in appearance.  Neck: The appearance of the neck is normal and no neck mass is present.  Pulmonary: No respiratory distress and normal respiratory rhythm and effort.  Cardiovascular: Heart rate and rhythm are normal . No peripheral edema.  Abdomen: The abdomen is mildly obese. The abdomen is soft and nontender. No masses are palpated. No  CVA tenderness. No hernias are palpable. No hepatosplenomegaly noted.  Genitourinary: Examination of the penis demonstrates no discharge, no masses, no lesions and a normal meatus. The scrotum is without lesions. The right epididymis is palpably normal and  non-tender. The left epididymis is palpably normal and non-tender. The right testis is non-tender and without masses. The left testis is non-tender and without masses.  Lymphatics: The femoral and inguinal nodes are not enlarged or tender.  Skin: Normal skin turgor, no visible rash and no visible skin lesions.  Neuro/Psych:. Mood and affect are appropriate.    Results/Data Urine     COLOR YELLOW   APPEARANCE CLOUDY   SPECIFIC GRAVITY 1.020   pH 6.5   GLUCOSE NEG mg/dL  BILIRUBIN NEG   KETONE NEG mg/dL  BLOOD SMALL   PROTEIN TRACE mg/dL  UROBILINOGEN 0.2 mg/dL  NITRITE NEG   LEUKOCYTE ESTERASE MOD   SQUAMOUS EPITHELIAL/HPF NONE SEEN   WBC 11-20 WBC/hpf  RBC 3-6 RBC/hpf  BACTERIA MODERATE   CRYSTALS NONE SEEN   CASTS NONE SEEN    Old records or history reviewed: Notes from Dr. Bosie Helper office.  The following images/tracing/specimen were independently visualized:  CT scan as above.  The following radiology reports were reviewed: CT scan.    Assessment Assessed  1. Proximal Ureteral Stone On The Left 592.1 2. Hydronephrosis On The Left 591      We discussed the management of urinary stones. These options include observation, ureteroscopy, shockwave lithotripsy, and PCNL. We discussed which options are relevant to these particular stones. We discussed the natural history of stones as well as the complications of untreated stones and the impact on quality of life without treatment as well as with each of the above listed treatments. We also discussed the efficacy of each treatment in its ability to clear the stone burden. With any of these management options I discussed the signs and symptoms of infection and the need for emergent treatment should these be experienced. For each option we discussed the ability of each procedure to clear the patient of their stone burden.  For observation I described the risks which include but are not limited to silent renal damage, life-threatening  infection, need for emergent surgery, failure to pass stone, and pain.  For ureteroscopy I described the risks which include heart attack, stroke, pulmonary embolus, death, bleeding, infection, damage to contiguous structures, positioning injury, ureteral stricture, ureteral avulsion, ureteral injury, need for ureteral stent, inability to perform ureteroscopy, need for an interval procedure, inability to clear stone burden, stent discomfort and pain.  For shockwave lithotripsy I described the risks which include arrhythmia, kidney contusion, kidney hemorrhage, need for transfusion, long-term risk of diabetes or hypertension, back discomfort, flank ecchymosis, flank abrasion, inability to break up stone, inability to pass stone fragments, Steinstrasse, infection associated with obstructing stones, need for different surgical procedure, need for repeat shockwave lithotripsy, and death.  For PCNL I described the risks including heart attack, sure, pulmonary embolus, death, positioning injury, pneumothorax, hydrothorax, need for chest tube, inability to clear stone burden, renal laceration, arterial venous fistula or malformation, need for embolization of kidney, loss of kidney or renal function, need for repeat procedure, need for prolonged nephrostomy tube, ureteral avulsion, fistula.   In his particular situation I think he would be asked suited for treatment with lithotripsy. We discussed the fact that a stent could be placed preoperatively however I think treatment without a stent would be reasonable with the understanding that if he does develop  obstructing fragments a stent would need to be placed. In addition he would need to come off his Coumadin and aspirin prior to the procedure. His kidney does appear obstructed by the stone and therefore this cannot be postponed for a significant duration of time without developing permanent damage to the kidney.   Plan    1. He will stop his aspirin and  Coumadin prior to his lithotripsy. 2. He is scheduled for lithotripsy of his left UPJ stone.

## 2012-12-27 HISTORY — PX: LITHOTRIPSY: SUR834

## 2013-01-21 ENCOUNTER — Other Ambulatory Visit: Payer: Self-pay | Admitting: Urology

## 2013-01-21 ENCOUNTER — Encounter (HOSPITAL_BASED_OUTPATIENT_CLINIC_OR_DEPARTMENT_OTHER): Payer: Self-pay | Admitting: *Deleted

## 2013-01-21 NOTE — Progress Notes (Addendum)
To [facility] at 0645- KUB,istat on arrival,has PT/INR scheduled for 01/22/13-check results in epic prior to a unnecessary  recheck on 01/26/13.Npo after Mn-instructed to use his inhaler that am and bring with him,also to bring his cpap and mask.

## 2013-01-25 NOTE — H&P (Signed)
History of Present Illness   Alan Ross is a 72 year old male with a history of left ureteral calculus disease.      Calculus: A CT scan performed 11/25/12 revealed a 15 mm stone located at the left UPJ causing some obstruction. No other renal calculi were noted.   He underwent lithotripsy of his left UPJ stone on 12/22/12. There was good fragmentation with passage of a majority of the fragments. He continued, however, to have left flank pain. His stone analysis revealed the stone was 85% calcium oxalate in 15% calcium phosphate.  Past Medical History Problems  1. History of  Adult Sleep Apnea 780.57 2. History of  Chronic Obstructive Pulmonary Disease 496 3. History of  Diabetes Mellitus 250.00 4. History of  Esophageal Reflux 530.81 5. History of  Gastric Ulcer 531.90 6. History of  Hyperlipidemia 272.4 7. History of  Pneumonia V12.61 8. 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 History of  Cath Stent Placement 2. History of  Surgery For Abdominal Aortic Aneurysm 3. History of  Tonsillectomy  Current Meds 1. Albuterol Sulfate NEBU; Therapy: (Recorded:18Feb2014) to 2. Aspirin 81 MG Oral Tablet; Therapy: (Recorded:18Feb2014) to 3. Ferrous Sulfate 325 MG CAPS; Therapy: (Recorded:18Feb2014) to 4. Fish Oil 1000 MG Oral Capsule; Therapy: (Recorded:18Feb2014) to 5. Folic Acid 1 MG Oral Tablet; Therapy: (Recorded:18Feb2014) to 6. Furosemide 20 MG Oral Tablet; Therapy: 25Jul2013 to 7. Niacin 500 MG Oral Tablet; Therapy: (Recorded:18Feb2014) to 8. Oxygen Use; Therapy: (Recorded:18Feb2014) to 9. Potassium Chloride ER 10 MEQ Oral Tablet Extended Release; Therapy: 21Jan2013 to 10. Simvastatin 40 MG Oral Tablet; Therapy: (Recorded:18Feb2014) to 11. Vitamin B-12 500 MCG Oral Tablet; Therapy: (Recorded:18Feb2014) to 12. Vitamin D3 10000 UNIT Oral Capsule; Therapy: (Recorded:18Feb2014) to 13. Warfarin Sodium TABS; Therapy:  (Recorded:18Feb2014) to  Allergies Medication  1. Penicillins  Family History Problems  1. Paternal history of  Acute Myocardial Infarction V17.3 2. Family history of  Death In The Family Father 3. Family history of  Death In The Family Mother 4. Maternal history of  Diabetes Mellitus V18.0 5. Family history of  Family Health Status Children ___ Living Sons  Social History Problems    Caffeine Use   Former Smoker V15.82   Marital History - Widowed   Retired From Work Denied    History of  Alcohol Use  Review of Systems Genitourinary, constitutional, skin, eye, otolaryngeal, hematologic/lymphatic, cardiovascular, pulmonary, endocrine, musculoskeletal, gastrointestinal, neurological and psychiatric system(s) were reviewed and pertinent findings if present are noted.  Genitourinary: urinary frequency and feelings of urinary urgency.  Respiratory: shortness of breath.  Psychiatric: anxiety.    Vitals Vital Signs  Blood Pressure: 128 / 80 Temperature: 97.1 F Heart Rate: 88  BMI Calculated: 28.84 BSA Calculated: 1.84 Height: 5 ft 4.5 in Weight: 171 lb   Physical Exam Constitutional: Well nourished and well developed . No acute distress.  ENT:. The ears and nose are normal in appearance.  Neck: The appearance of the neck is normal and no neck mass is present.  Pulmonary: No respiratory distress and normal respiratory rhythm and effort.  Cardiovascular: Heart rate and rhythm are normal . No peripheral edema.  Abdomen: The abdomen is mildly obese. The abdomen is soft and nontender. No masses are palpated. No CVA tenderness. No hernias are palpable. No hepatosplenomegaly noted.  Genitourinary: Examination of the penis demonstrates no discharge, no masses, no lesions and a normal meatus. The scrotum is without lesions. The right  epididymis is palpably normal and non-tender. The left epididymis is palpably normal and non-tender. The right testis is non-tender and without  masses. The left testis is non-tender and without masses.  Lymphatics: The femoral and inguinal nodes are not enlarged or tender.  Skin: Normal skin turgor, no visible rash and no visible skin lesions.  Neuro/Psych:. Mood and affect are appropriate.    KUB: stable calcifications in the lower pelvis- I do believe one or two are ureteral fragments and the rest are pelvic phleboliths; no other obvious stones seen within either kidney or along the contour of the ureters left renal u/s: There are no masses or lesions seen.  There is a stone seen within the left kidney.  There is also mild hydronephrosis present.  A calcification is seen within the bladder, but the bladder is otherwise empty and without abnormalities.  Impression: With continued left flank pain and calcifications seen in the distal left ureteral region the patient was counseled regarding the fact that these are very likely stone fragments that have failed to pass spontaneously despite medical expulsive therapy. It is possible that these may not actually be ureteral calculi and as he has multiple calcifications within the pelvis although most appear to be round and consistent with phleboliths. I therefore have discussed proceeding with cystoscopy and left retrograde pyelogram to confirm the presence of distal ureteral stones and then treat these ureteroscopically. I've gone over the procedure in detail including its risks and complications, the alternatives, and anticipated postoperative course and the probability of success. The patient understands and has elected to proceed.   Plan: Cystoscopy with left retrograde pyelogram and probable ureteroscopy with stone extraction.

## 2013-01-26 ENCOUNTER — Encounter (HOSPITAL_BASED_OUTPATIENT_CLINIC_OR_DEPARTMENT_OTHER)
Admission: RE | Disposition: A | Discharge status: Home / Self Care | Payer: Self-pay | Source: Ambulatory Visit | Attending: Urology

## 2013-01-26 ENCOUNTER — Ambulatory Visit (HOSPITAL_COMMUNITY): Payer: Medicare Other

## 2013-01-26 ENCOUNTER — Encounter (HOSPITAL_BASED_OUTPATIENT_CLINIC_OR_DEPARTMENT_OTHER): Payer: Self-pay | Admitting: Anesthesiology

## 2013-01-26 ENCOUNTER — Ambulatory Visit (HOSPITAL_BASED_OUTPATIENT_CLINIC_OR_DEPARTMENT_OTHER)
Admission: RE | Admit: 2013-01-26 | Discharge: 2013-01-26 | Disposition: A | Discharge status: Home / Self Care | Payer: Medicare Other | Source: Ambulatory Visit | Attending: Urology | Admitting: Urology

## 2013-01-26 ENCOUNTER — Encounter (HOSPITAL_BASED_OUTPATIENT_CLINIC_OR_DEPARTMENT_OTHER): Payer: Self-pay | Admitting: *Deleted

## 2013-01-26 ENCOUNTER — Ambulatory Visit (HOSPITAL_BASED_OUTPATIENT_CLINIC_OR_DEPARTMENT_OTHER): Payer: Medicare Other | Admitting: Anesthesiology

## 2013-01-26 DIAGNOSIS — E785 Hyperlipidemia, unspecified: Secondary | ICD-10-CM | POA: Insufficient documentation

## 2013-01-26 DIAGNOSIS — G4730 Sleep apnea, unspecified: Secondary | ICD-10-CM | POA: Insufficient documentation

## 2013-01-26 DIAGNOSIS — Z8711 Personal history of peptic ulcer disease: Secondary | ICD-10-CM | POA: Insufficient documentation

## 2013-01-26 DIAGNOSIS — N201 Calculus of ureter: Secondary | ICD-10-CM | POA: Insufficient documentation

## 2013-01-26 DIAGNOSIS — E119 Type 2 diabetes mellitus without complications: Secondary | ICD-10-CM | POA: Insufficient documentation

## 2013-01-26 DIAGNOSIS — Z7901 Long term (current) use of anticoagulants: Secondary | ICD-10-CM | POA: Insufficient documentation

## 2013-01-26 DIAGNOSIS — Z8701 Personal history of pneumonia (recurrent): Secondary | ICD-10-CM | POA: Insufficient documentation

## 2013-01-26 DIAGNOSIS — F411 Generalized anxiety disorder: Secondary | ICD-10-CM | POA: Insufficient documentation

## 2013-01-26 DIAGNOSIS — K219 Gastro-esophageal reflux disease without esophagitis: Secondary | ICD-10-CM | POA: Insufficient documentation

## 2013-01-26 DIAGNOSIS — J449 Chronic obstructive pulmonary disease, unspecified: Secondary | ICD-10-CM | POA: Insufficient documentation

## 2013-01-26 DIAGNOSIS — Z880 Allergy status to penicillin: Secondary | ICD-10-CM | POA: Insufficient documentation

## 2013-01-26 DIAGNOSIS — I739 Peripheral vascular disease, unspecified: Secondary | ICD-10-CM | POA: Insufficient documentation

## 2013-01-26 DIAGNOSIS — Z79899 Other long term (current) drug therapy: Secondary | ICD-10-CM | POA: Insufficient documentation

## 2013-01-26 DIAGNOSIS — Z86718 Personal history of other venous thrombosis and embolism: Secondary | ICD-10-CM | POA: Insufficient documentation

## 2013-01-26 DIAGNOSIS — J4489 Other specified chronic obstructive pulmonary disease: Secondary | ICD-10-CM | POA: Insufficient documentation

## 2013-01-26 DIAGNOSIS — Z7982 Long term (current) use of aspirin: Secondary | ICD-10-CM | POA: Insufficient documentation

## 2013-01-26 HISTORY — PX: CYSTOSCOPY WITH RETROGRADE PYELOGRAM, URETEROSCOPY AND STENT PLACEMENT: SHX5789

## 2013-01-26 HISTORY — DX: Sleep apnea, unspecified: G47.30

## 2013-01-26 LAB — POCT I-STAT 4, (NA,K, GLUC, HGB,HCT)
Glucose, Bld: 77 mg/dL (ref 70–99)
HCT: 28 % — ABNORMAL LOW (ref 39.0–52.0)
Hemoglobin: 9.5 g/dL — ABNORMAL LOW (ref 13.0–17.0)
Potassium: 3.5 mEq/L (ref 3.5–5.1)
Sodium: 137 mEq/L (ref 135–145)

## 2013-01-26 LAB — PROTIME-INR: Prothrombin Time: 20 seconds — ABNORMAL HIGH (ref 11.6–15.2)

## 2013-01-26 SURGERY — CYSTOURETEROSCOPY, WITH RETROGRADE PYELOGRAM AND STENT INSERTION
Anesthesia: General

## 2013-01-26 MED ORDER — OXYCODONE HCL 5 MG PO TABS
5.0000 mg | ORAL_TABLET | Freq: Once | ORAL | Status: DC | PRN
  Filled 2013-01-26: qty 1

## 2013-01-26 MED ORDER — HYDROMORPHONE HCL PF 1 MG/ML IJ SOLN
0.2500 mg | INTRAMUSCULAR | Status: DC | PRN
  Filled 2013-01-26: qty 1

## 2013-01-26 MED ORDER — ONDANSETRON HCL 4 MG/2ML IJ SOLN
INTRAMUSCULAR | Status: DC | PRN
  Administered 2013-01-26: 4 mg via INTRAVENOUS

## 2013-01-26 MED ORDER — SODIUM CHLORIDE 0.9 % IR SOLN
Status: DC | PRN
  Administered 2013-01-26: 9000 mL via INTRAVESICAL

## 2013-01-26 MED ORDER — IOHEXOL 350 MG/ML SOLN
INTRAVENOUS | Status: DC | PRN
  Administered 2013-01-26: 50 mL via URETHRAL

## 2013-01-26 MED ORDER — FENTANYL CITRATE 0.05 MG/ML IJ SOLN
INTRAMUSCULAR | Status: DC | PRN
  Administered 2013-01-26: 50 ug via INTRAVENOUS
  Administered 2013-01-26 (×2): 25 ug via INTRAVENOUS

## 2013-01-26 MED ORDER — PROPOFOL 10 MG/ML IV BOLUS
INTRAVENOUS | Status: DC | PRN
  Administered 2013-01-26: 140 mg via INTRAVENOUS

## 2013-01-26 MED ORDER — HYDROCODONE-ACETAMINOPHEN 7.5-325 MG PO TABS: 1.0000 | ORAL_TABLET | ORAL | Status: AC | PRN

## 2013-01-26 MED ORDER — CIPROFLOXACIN IN D5W 400 MG/200ML IV SOLN
400.0000 mg | INTRAVENOUS | Status: AC
  Administered 2013-01-26: 400 mg via INTRAVENOUS
  Filled 2013-01-26: qty 200

## 2013-01-26 MED ORDER — MEPERIDINE HCL 25 MG/ML IJ SOLN
6.2500 mg | INTRAMUSCULAR | Status: DC | PRN
  Filled 2013-01-26: qty 1

## 2013-01-26 MED ORDER — DEXAMETHASONE SODIUM PHOSPHATE 4 MG/ML IJ SOLN
INTRAMUSCULAR | Status: DC | PRN
  Administered 2013-01-26: 8 mg via INTRAVENOUS

## 2013-01-26 MED ORDER — LIDOCAINE HCL (CARDIAC) 20 MG/ML IV SOLN
INTRAVENOUS | Status: DC | PRN
  Administered 2013-01-26: 50 mg via INTRAVENOUS

## 2013-01-26 MED ORDER — PHENAZOPYRIDINE HCL 200 MG PO TABS: 200.0000 mg | ORAL_TABLET | Freq: Three times a day (TID) | ORAL | Status: AC | PRN

## 2013-01-26 MED ORDER — PROMETHAZINE HCL 25 MG/ML IJ SOLN
6.2500 mg | INTRAMUSCULAR | Status: DC | PRN
  Filled 2013-01-26: qty 1

## 2013-01-26 MED ORDER — LIDOCAINE HCL 2 % EX GEL
CUTANEOUS | Status: DC | PRN
  Administered 2013-01-26: 1

## 2013-01-26 MED ORDER — OXYCODONE HCL 5 MG/5ML PO SOLN
5.0000 mg | Freq: Once | ORAL | Status: DC | PRN
  Filled 2013-01-26: qty 5

## 2013-01-26 MED ORDER — LACTATED RINGERS IV SOLN
INTRAVENOUS | Status: DC
  Administered 2013-01-26: 100 mL/h via INTRAVENOUS
  Filled 2013-01-26: qty 1000

## 2013-01-26 MED ORDER — ACETAMINOPHEN 10 MG/ML IV SOLN
1000.0000 mg | Freq: Once | INTRAVENOUS | Status: DC | PRN
  Filled 2013-01-26: qty 100

## 2013-01-26 SURGICAL SUPPLY — 35 items
ADAPTER CATH URET PLST 4-6FR (CATHETERS) IMPLANT
BAG DRAIN URO-CYSTO SKYTR STRL (DRAIN) ×3 IMPLANT
BASKET LASER NITINOL 1.9FR (BASKET) IMPLANT
BASKET STNLS GEMINI 4WIRE 3FR (BASKET) IMPLANT
BASKET ZERO TIP NITINOL 2.4FR (BASKET) IMPLANT
BRUSH URET BIOPSY 3F (UROLOGICAL SUPPLIES) IMPLANT
CANISTER SUCT LVC 12 LTR MEDI- (MISCELLANEOUS) IMPLANT
CATH CLEAR GEL 3F BACKSTOP (CATHETERS) IMPLANT
CATH INTERMIT  6FR 70CM (CATHETERS) IMPLANT
CATH URET 5FR 28IN CONE TIP (BALLOONS)
CATH URET 5FR 70CM CONE TIP (BALLOONS) IMPLANT
CLOTH BEACON ORANGE TIMEOUT ST (SAFETY) ×3 IMPLANT
DRAPE CAMERA CLOSED 9X96 (DRAPES) ×3 IMPLANT
ELECT REM PT RETURN 9FT ADLT (ELECTROSURGICAL)
ELECTRODE REM PT RTRN 9FT ADLT (ELECTROSURGICAL) IMPLANT
GLOVE BIO SURGEON STRL SZ8 (GLOVE) ×3 IMPLANT
GLOVE BIOGEL M 6.5 STRL (GLOVE) ×3 IMPLANT
GLOVE BIOGEL M 7.0 STRL (GLOVE) ×3 IMPLANT
GOWN PREVENTION PLUS LG XLONG (DISPOSABLE) ×3 IMPLANT
GOWN STRL REIN XL XLG (GOWN DISPOSABLE) ×3 IMPLANT
GUIDEWIRE 0.038 PTFE COATED (WIRE) IMPLANT
GUIDEWIRE ANG ZIPWIRE 038X150 (WIRE) IMPLANT
GUIDEWIRE STR DUAL SENSOR (WIRE) ×3 IMPLANT
IV NS IRRIG 3000ML ARTHROMATIC (IV SOLUTION) ×6 IMPLANT
KIT BALLIN UROMAX 15FX10 (LABEL) IMPLANT
KIT BALLN UROMAX 15FX4 (MISCELLANEOUS) IMPLANT
KIT BALLN UROMAX 26 75X4 (MISCELLANEOUS)
LASER FIBER DISP (UROLOGICAL SUPPLIES) IMPLANT
PACK CYSTOSCOPY (CUSTOM PROCEDURE TRAY) ×3 IMPLANT
SET HIGH PRES BAL DIL (LABEL)
SHEATH ACCESS URETERAL 38CM (SHEATH) IMPLANT
SHEATH ACCESS URETERAL 54CM (SHEATH) IMPLANT
SHEATH URET ACCESS 12FR/35CM (UROLOGICAL SUPPLIES) ×3 IMPLANT
SHEATH URET ACCESS 12FR/55CM (UROLOGICAL SUPPLIES) IMPLANT
WATER STERILE IRR 3000ML UROMA (IV SOLUTION) IMPLANT

## 2013-01-26 NOTE — Interval H&P Note (Signed)
History and Physical Interval Note:  01/26/2013 8:04 AM  Alan Ross  has presented today for surgery, with the diagnosis of LEFT URETERAL STONE. He continues to have intermittent sharp left sided pain.   The various methods of treatment have been discussed with the patient and family. After consideration of risks, benefits and other options for treatment, the patient has consented to  Procedure(s): CYSTOSCOPY WITH LEFT RETROGRADE PYELOGRAM, LEFT URETEROSCOPY  (Left) HOLMIUM LASER APPLICATION (N/A) as a surgical intervention .  The patient's history has been reviewed, patient examined, no change in status, stable for surgery.  I have reviewed the patient's chart and labs.  Questions were answered to the patient's satisfaction.     Garnett Farm

## 2013-01-26 NOTE — Anesthesia Procedure Notes (Signed)
Procedure Name: LMA Insertion Date/Time: 01/26/2013 8:33 AM Performed by: Renella Cunas D Pre-anesthesia Checklist: Patient identified, Emergency Drugs available, Suction available and Patient being monitored Patient Re-evaluated:Patient Re-evaluated prior to inductionOxygen Delivery Method: Circle System Utilized Preoxygenation: Pre-oxygenation with 100% oxygen Intubation Type: IV induction Ventilation: Mask ventilation without difficulty LMA: LMA inserted LMA Size: 4.0 Number of attempts: 1 Airway Equipment and Method: bite block Placement Confirmation: positive ETCO2 Tube secured with: Tape Dental Injury: Teeth and Oropharynx as per pre-operative assessment

## 2013-01-26 NOTE — Transfer of Care (Signed)
Immediate Anesthesia Transfer of Care Note  Patient: Alan Ross  Procedure(s) Performed: Procedure(s) (LRB): CYSTOSCOPY WITH LEFT RETROGRADE PYELOGRAM, LEFT URETEROSCOPY  (Left)  Patient Location: PACU  Anesthesia Type: General  Level of Consciousness: awake, oriented, sedated and patient cooperative  Airway & Oxygen Therapy: Patient Spontanous Breathing and Patient connected to face mask oxygen  Post-op Assessment: Report given to PACU RN and Post -op Vital signs reviewed and stable  Post vital signs: Reviewed and stable  Complications: No apparent anesthesia complications

## 2013-01-26 NOTE — Anesthesia Preprocedure Evaluation (Signed)
Anesthesia Evaluation  Patient identified by MRN, date of birth, ID band Patient awake    Reviewed: Allergy & Precautions, H&P , NPO status , Patient's Chart, lab work & pertinent test results  Airway Mallampati: I TM Distance: >3 FB Neck ROM: Full    Dental  (+) Teeth Intact, Dental Advisory Given, Edentulous Upper, Missing and Poor Dentition   Pulmonary sleep apnea, Continuous Positive Airway Pressure Ventilation and Oxygen sleep apnea , COPD breath sounds clear to auscultation  Pulmonary exam normal       Cardiovascular + Peripheral Vascular Disease Rhythm:Regular Rate:Normal     Neuro/Psych negative neurological ROS  negative psych ROS   GI/Hepatic negative GI ROS, Neg liver ROS,   Endo/Other  negative endocrine ROS  Renal/GU negative Renal ROS     Musculoskeletal negative musculoskeletal ROS (+)   Abdominal   Peds  Hematology negative hematology ROS (+)   Anesthesia Other Findings   Reproductive/Obstetrics                           Anesthesia Physical Anesthesia Plan  ASA: III  Anesthesia Plan: General   Post-op Pain Management:    Induction: Intravenous  Airway Management Planned: LMA  Additional Equipment:   Intra-op Plan:   Post-operative Plan: Extubation in OR  Informed Consent: I have reviewed the patients History and Physical, chart, labs and discussed the procedure including the risks, benefits and alternatives for the proposed anesthesia with the patient or authorized representative who has indicated his/her understanding and acceptance.   Dental advisory given  Plan Discussed with: CRNA  Anesthesia Plan Comments:         Anesthesia Quick Evaluation

## 2013-01-26 NOTE — Anesthesia Postprocedure Evaluation (Signed)
Anesthesia Post Note  Patient: Alan Ross  Procedure(s) Performed: Procedure(s) (LRB): CYSTOSCOPY WITH LEFT RETROGRADE PYELOGRAM, LEFT URETEROSCOPY  (Left)  Anesthesia type: General  Patient location: PACU  Post pain: Pain level controlled  Post assessment: Post-op Vital signs reviewed  Last Vitals: BP 125/73  Pulse 93  Temp(Src) 36.6 C (Oral)  Resp 18  Ht 5' 4.5" (1.638 m)  Wt 142 lb (64.411 kg)  BMI 24.01 kg/m2  SpO2 98%  Post vital signs: Reviewed  Level of consciousness: sedated  Complications: No apparent anesthesia complications

## 2013-01-26 NOTE — Op Note (Signed)
PATIENT:  Alan Ross  PRE-OPERATIVE DIAGNOSIS: 1. History of left ureteral calculus. 2. Intermittent left-sided pain  POST-OPERATIVE DIAGNOSIS: 1. History of left ureteral calculus. 2. Intermittent left-sided pain (non-urologic)  PROCEDURE:  1. Cystoscopy 2. Left retrograde pyelogram with interpretation 3. Left ureteroscopy  SURGEON: Garnett Farm, MD  INDICATION: Mr. Loken is a 72 year old male who had a left proximal ureteral stone treated with lithotripsy. He passed multiple fragments. Despite this he continued to have pain on the left side. He was evaluated with an ultrasound revealed no evidence of perinephric fluid collection/hematoma with possible mild fullness of the collecting system. His KUBs have revealed what appeared to be calcifications in the pelvis that are primarily phleboliths although do to the presence of multiple different calcifications it is difficult to discern if any of these are definitely distal ureteral stones. We therefore have discussed further evaluation with retrograde pyelogram and ureteroscopy.  ANESTHESIA:  General  EBL:  Minimal  DRAINS: None  DESCRIPTION OF PROCEDURE: The patient was taken to the major OR and placed on the table. General anesthesia was administered and then the patient was moved to the dorsal lithotomy position. The genitalia was sterilely prepped and draped. An official timeout was performed.  Initially the 22 French cystoscope with 12 lens was passed under direct vision down the urethra which was noted be normal. The prostatic urethra revealed bilobar hypertrophy but no lesions were noted. The bladder was then entered and fully inspected. It was noted be free of any tumors stones or inflammatory lesions. There was 1+ trabeculation. Ureteral orifices were of normal configuration and position. A 6 French open-ended ureteral catheter was then passed through the cystoscope into the ureteral orifice in order to perform a left retrograde  pyelogram.  A retrograde pyelogram was performed by injecting full-strength contrast up the left ureter under direct fluoroscopic control. It revealed no definite filling defects in the distal lureter. The remainder of the ureter was noted to be normal as was the intrarenal collecting system. I then passed a 0.038 inch floppy-tipped guidewire through the open ended catheter and into the area of the renal pelvis and this was left in place. The inner portion of a ureteral access sheath was then passed over the guidewire to gently dilate the intramural ureter. I then proceeded with ureteroscopy in order to be absolutely sure there was no stone present causing his intermittent pain.  A 6 French rigid ureteroscope was then passed under direct into the bladder and into the left orifice and up the ureter. No stone was identified throughout the entire course of the ureter. I was able to pass the ureteroscope to the level of the UPJ. I then backed the scope down the ureter under direct vision once again noting no stone or or other abnormality. Ureteroscope was then removed. The bladder was drained using the cystoscope sheath. I then instilled 2% lidocaine jelly in the urethra and applied a penile clamp. The patient tolerated the procedure well no intraoperative complications.  PLAN OF CARE: Discharge to home after PACU  PATIENT DISPOSITION:  PACU - hemodynamically stable.

## 2013-01-27 ENCOUNTER — Encounter (HOSPITAL_BASED_OUTPATIENT_CLINIC_OR_DEPARTMENT_OTHER): Payer: Self-pay | Admitting: Urology

## 2013-04-28 DEATH — deceased

## 2013-05-02 IMAGING — CT CT CTA ABD/PEL W/CM AND/OR W/O CM
2 of 9 series · 11 of 46 positions shown, 17 images · IV contrast ([ID] [id number])
Comparison: 11/27/2011

CLINICAL DATA: Aortic aneurysm post stent graft.

CT ANGIOGRAPHY ABDOMEN AND PELVIS WITH CONTRAST AND WITHOUT
CONTRAST

[Series 4: angio · axial · 0.78mm/px · z∈[-328,-8]mm · 10 of 220 slices shown, 16 images]
[im 20/220  soft-tissue]
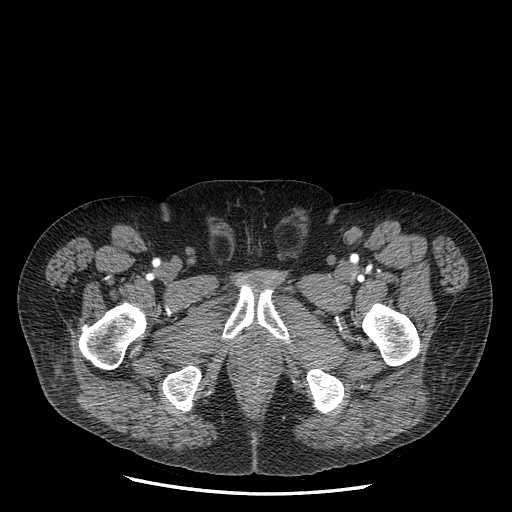
[im 20/220  bone]
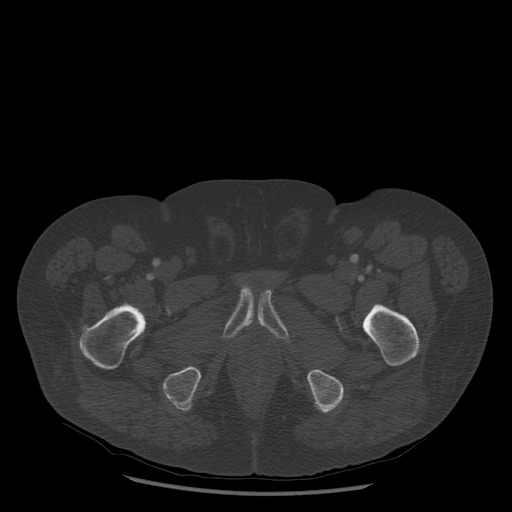
[im 40/220  soft-tissue]
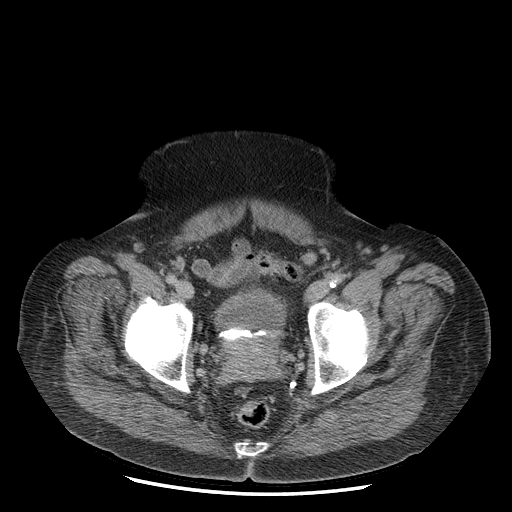
[im 60/220  soft-tissue]
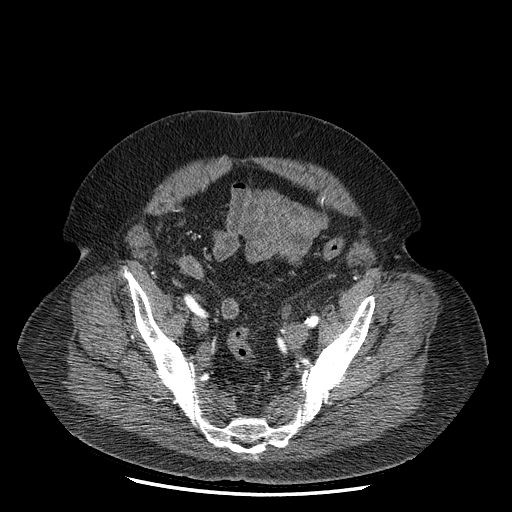
[im 80/220  soft-tissue]
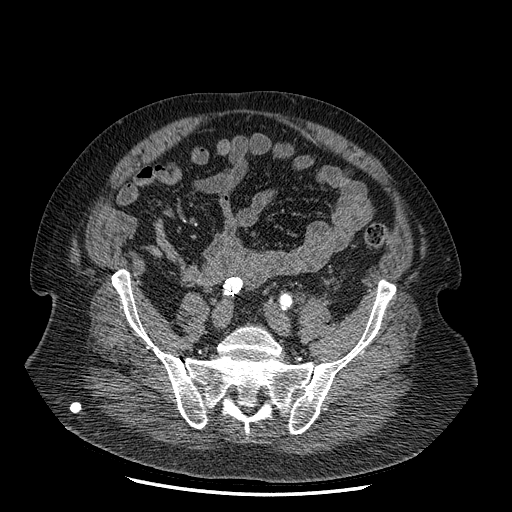
[im 100/220  soft-tissue]
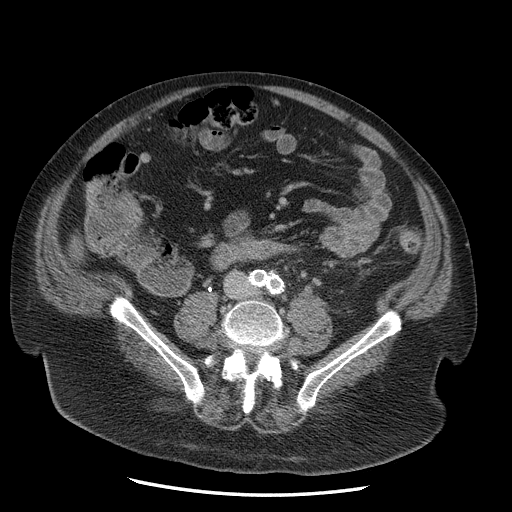
[im 120/220  soft-tissue]
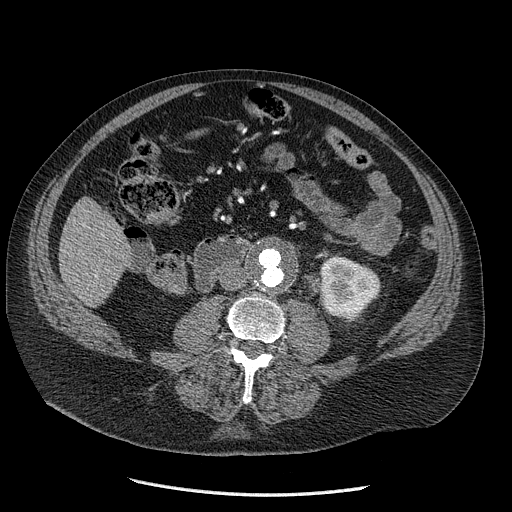
[im 140/220  soft-tissue]
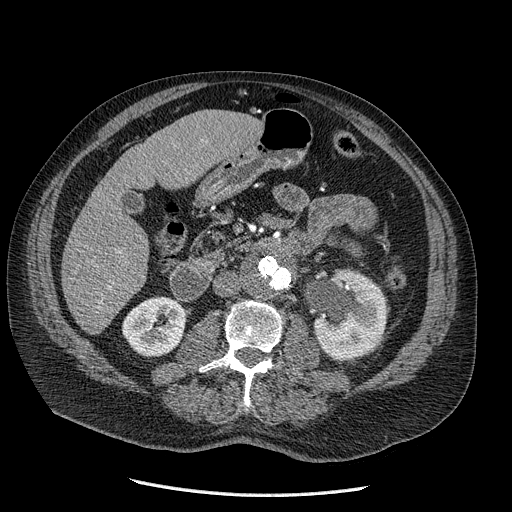
[im 140/220  lung]
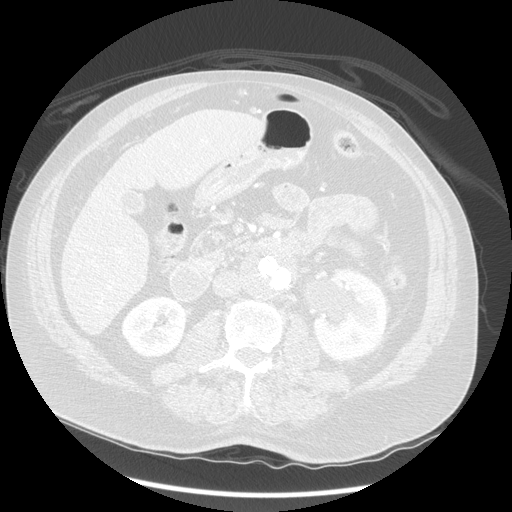
[im 160/220  soft-tissue]
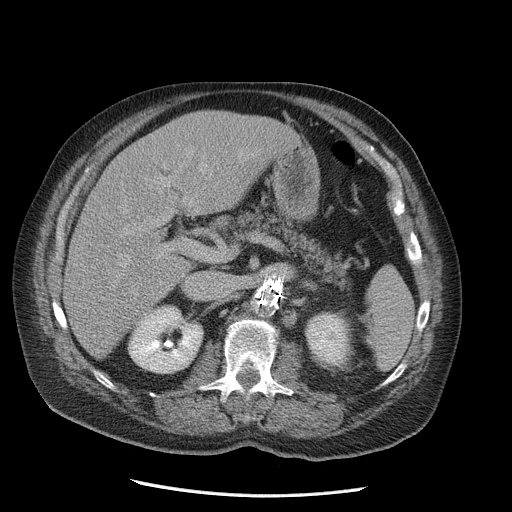
[im 160/220  lung]
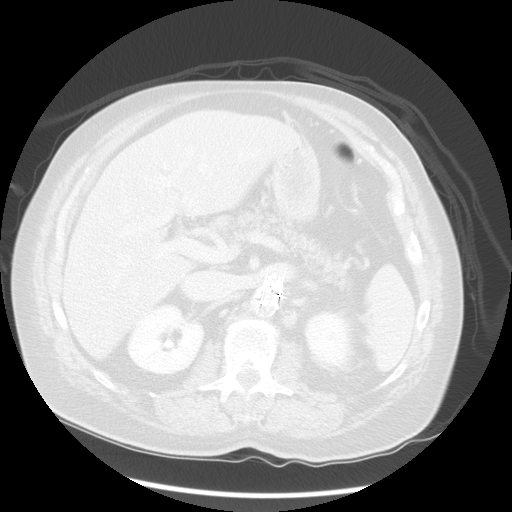
[im 180/220  soft-tissue]
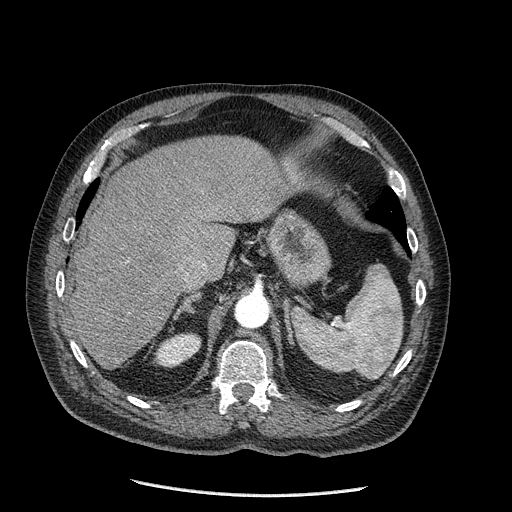
[im 180/220  lung]
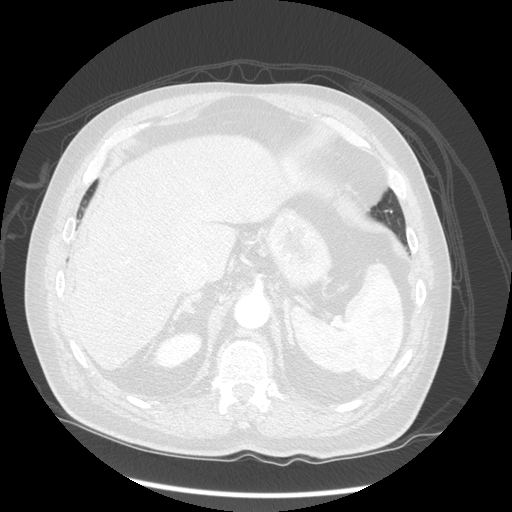
[im 180/220  bone]
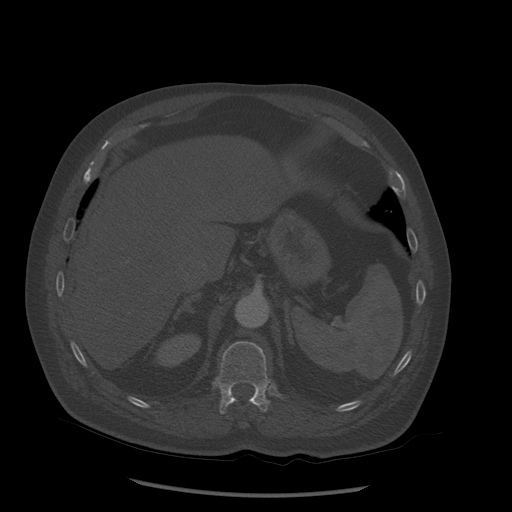
[im 200/220  soft-tissue]
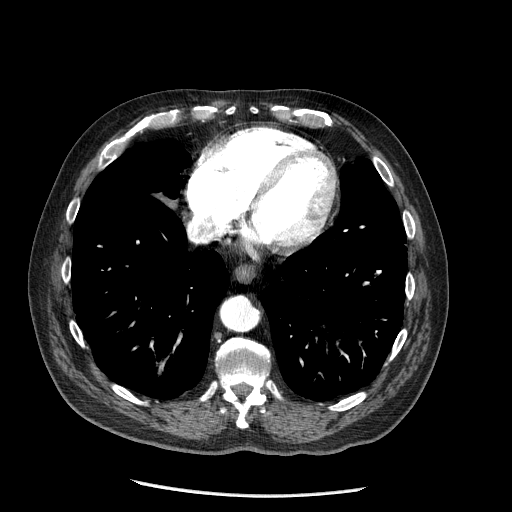
[im 200/220  lung]
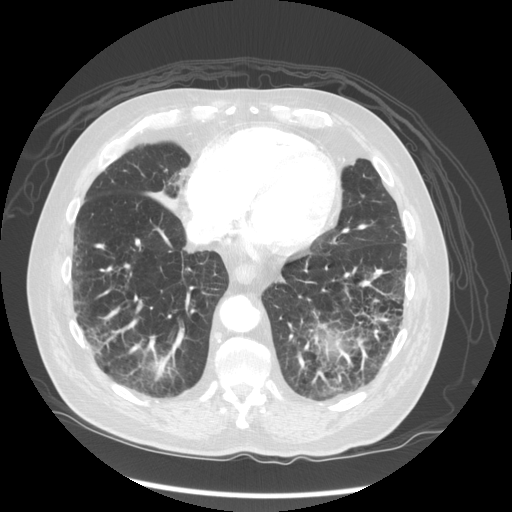

[Series 602: sagittal body · sagittal · 0.82mm/px · 1 of 154 slices shown]
[im 22/154  soft-tissue]
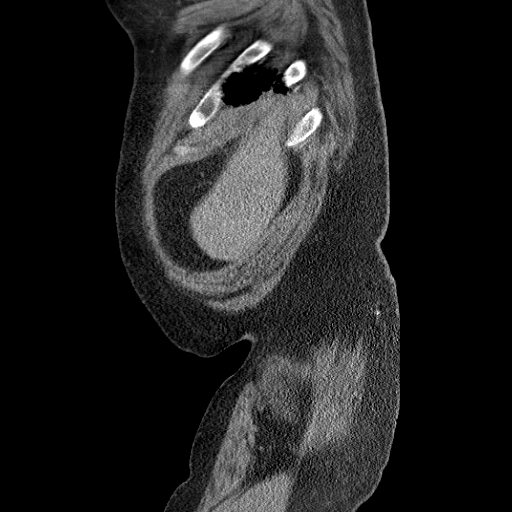

[11 of 46 positions shown; findings below may reference images not displayed]

Arterial findings:
Aorta:                  Mild eccentric nonocclusive mural thrombus
in the visualized distal descending thoracic segment.  Patent
infrarenal bifurcated stent graft.  Decrease in native aneurysm sac
size, 4.4 x 4.6 cm (previously 5.2 x 5.6).  No endoleak.

Celiac axis:            Patent, unremarkable distal branching.

Superior mesenteric:Patent, with minimal eccentric nonocclusive
plaque, normal distal branching anatomy.

Left renal:             Duplicated.  Both arising nearly the same
level.  The anterior moiety is diminutive, supplying only a portion
of the anterior division.  Posterior body is dominant, widely
patent.

Right renal:            Duplicated, both arising nearly the same
axial level.  The anterior moiety is dominant, widely patent.
Posterior moiety supplies only a portion of the posterior division,
without focal lesion.

Inferior mesenteric:Origin occlusion, with distal reconstitution by
visceral collaterals.

Left iliac:             Left limb of the stent graft is patent,
extending to the proximal common iliac without endoleak.  There is
scattered nonocclusive plaque through the distal common, external,
and internal iliacs without aneurysm or stenosis.

Right iliac:            Right limb of the stent graft extends to
the mid common iliac, patent without endoleak.  The distal native
iliac arterial system as scattered calcified plaque, no stenosis or
aneurysm.

Venous findings:  Patent hepatic veins, portal vein, splenic vein,
SMV, bilateral renal veins, IVC, and iliac venous system.

 Review of the MIP images confirms the above findings.

Nonvascular findings: 15 mm obstructing calculus at the left UPJ
with left hydronephrosis and mild inflammatory/edematous changes
around the left kidney and renal collecting system.

Unremarkable liver, nondistended gallbladder, spleen, adrenal
glands, right kidney.  Diffuse pancreatic parenchymal atrophy
without focal lesion.  Stomach, small bowel, and colon are
nondilated.  There are bilateral inguinal hernias.  The one on  the
right involves a loop of small bowel without evidence of
obstruction or strangulation.
The left contains only mesenteric fat.  Urinary bladder is
incompletely distended with multiple layering small calcifications.
There is marked enlargement of the prostate.  No ascites.  No free
air.  12 mm left para-aortic lymph node   at the level of the left
renal arteries is slightly more conspicuous than on prior study.
No other retroperitoneal, mesenteric, or pelvic adenopathy.Mild
spondylitic changes in the lumbar spine.  Coarse scarring in the
visualized lung bases with a calcified granuloma in the lateral
basal segment left lower lobe.
IMPRESSION: 1.  Stable patent infrarenal bifurcated stent graft with no
endoleak, interval decrease in size of native aneurysm sac.
2.  Obstructing 15 mm left UPJ calculus with hydronephrosis.
3.  Bilateral inguinal hernias, the right containing a loop of
small bowel without obstruction or strangulation.

## 2013-05-29 IMAGING — CR DG ABDOMEN 1V
1 series · 1 of 1 positions shown · non-contrast
Comparison: CT abdomen and pelvis 11/25/2012.  Abdomen 03/02/2011.

CLINICAL DATA: Left renal stone Prelone for trips seen.

ABDOMEN - 1 VIEW

[t abdomen supine]
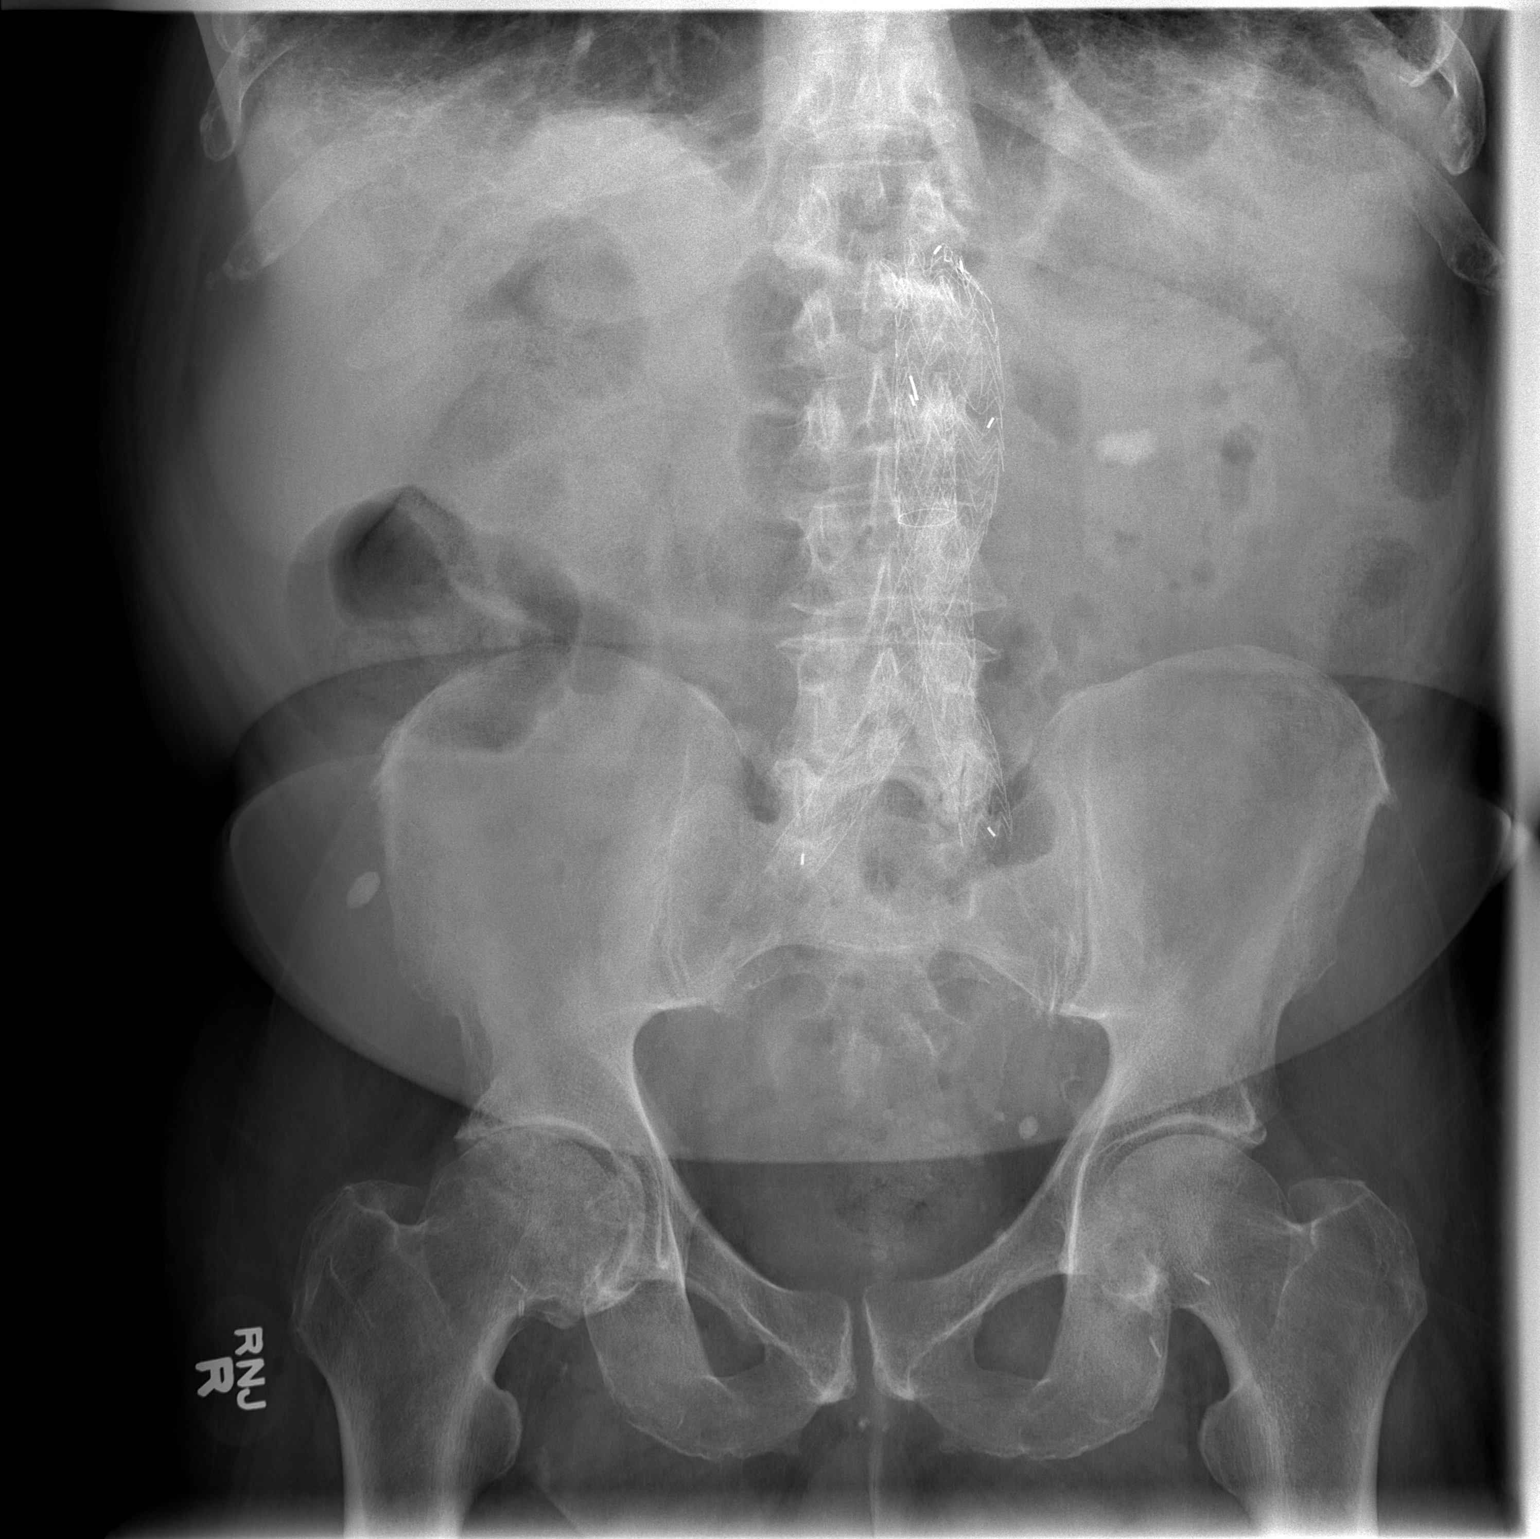

[1 of 1 positions shown; findings below may reference images not displayed]

FINDINGS: Ovoid stone projected over the left renal pelvic region
measuring 10 x 18 mm.  Calcifications in the pelvis consistent with
phleboliths.  Ovoid density in the right lower quadrant probably
represents opaque medication in the bowel.  Bowel gas pattern is
normal with scattered gas and stool in the colon.  No small or
large bowel distension.  Degenerative changes in the lumbar spine
and hips.  Aortoiliac stent graft.
IMPRESSION: 10 x 18 mm stone projected over the left renal pelvis.

## 2013-11-11 ENCOUNTER — Telehealth: Payer: Self-pay | Admitting: Vascular Surgery

## 2013-11-11 NOTE — Telephone Encounter (Signed)
DECEASED PATIENT DOB Nov 03, 2040 DOD 25-Sep-2013 PER FRIEND PATIENT HAD HEART PROBLEMS

## 2013-11-19 NOTE — Telephone Encounter (Signed)
Confirmed date of death was June 19,2014 per obituary.

## 2013-12-01 ENCOUNTER — Ambulatory Visit: Payer: Medicare Other | Admitting: Vascular Surgery

## 2013-12-01 ENCOUNTER — Other Ambulatory Visit: Payer: Medicare Other

## 2014-05-31 NOTE — Telephone Encounter (Signed)
Patients status changed to deceased in [facility]. Alan Ross ° °
